# Patient Record
Sex: Male | Born: 1995 | Race: Black or African American | Hispanic: No | Marital: Single | Smoking: Never smoker
Health system: Southern US, Community
[De-identification: ages and names within clinical notes are randomized; demographics above are authoritative.]

## PROBLEM LIST (undated history)

## (undated) DIAGNOSIS — R55 Syncope and collapse: Secondary | ICD-10-CM

## (undated) HISTORY — DX: Syncope and collapse: R55

---
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
## 2017-01-08 ENCOUNTER — Ambulatory Visit (INDEPENDENT_AMBULATORY_CARE_PROVIDER_SITE_OTHER): Payer: No Typology Code available for payment source | Admitting: Physician Assistant

## 2017-01-08 ENCOUNTER — Encounter: Payer: Self-pay | Admitting: Physician Assistant

## 2017-01-08 VITALS — BP 110/76 | HR 64 | Ht 70.0 in | Wt 174.4 lb

## 2017-01-08 DIAGNOSIS — M545 Low back pain: Secondary | ICD-10-CM

## 2017-01-08 NOTE — Patient Instructions (Addendum)
Please perform the exercise program that Derek Chavez has prepared for you and gone over in detail.  In addition to the handout you were provided you can access your program through: [email or web address]   Your unique program code is: [email or web address]    It was great meeting you today.   Consider taking 400mg  ibuprofen every 6 hours to help with your pain.  You may continue the flexeril.  I have put in the referral, you will be contacted about this. If your pain suddenly worsens, please let someone know so you can be re-evaluated.   Back Pain, Adult Back pain is very common in adults.The cause of back pain is rarely dangerous and the pain often gets better over time.The cause of your back pain may not be known. Some common causes of back pain include:  Strain of the muscles or ligaments supporting the spine.  Wear and tear (degeneration) of the spinal disks.  Arthritis.  Direct injury to the back. For many people, back pain may return. Since back pain is rarely dangerous, most people can learn to manage this condition on their own. Follow these instructions at home: Watch your back pain for any changes. The following actions may help to lessen any discomfort you are feeling:  Remain active. It is stressful on your back to sit or stand in one place for long periods of time. Do not sit, drive, or stand in one place for more than 30 minutes at a time. Take short walks on even surfaces as soon as you are able.Try to increase the length of time you walk each day.  Exercise regularly as directed by your health care provider. Exercise helps your back heal faster. It also helps avoid future injury by keeping your muscles strong and flexible.  Do not stay in bed.Resting more than 1-2 days can delay your recovery.  Pay attention to your body when you bend and lift. The most comfortable positions are those that put less stress on your recovering back. Always use proper lifting techniques,  including:  Bending your knees.  Keeping the load close to your body.  Avoiding twisting.  Find a comfortable position to sleep. Use a firm mattress and lie on your side with your knees slightly bent. If you lie on your back, put a pillow under your knees.  Avoid feeling anxious or stressed.Stress increases muscle tension and can worsen back pain.It is important to recognize when you are anxious or stressed and learn ways to manage it, such as with exercise.  Take medicines only as directed by your health care provider. Over-the-counter medicines to reduce pain and inflammation are often the most helpful.Your health care provider may prescribe muscle relaxant drugs.These medicines help dull your pain so you can more quickly return to your normal activities and healthy exercise.  Apply ice to the injured area:  Put ice in a plastic bag.  Place a towel between your skin and the bag.  Leave the ice on for 20 minutes, 2-3 times a day for the first 2-3 days. After that, ice and heat may be alternated to reduce pain and spasms.  Maintain a healthy weight. Excess weight puts extra stress on your back and makes it difficult to maintain good posture. Contact a health care provider if:  You have pain that is not relieved with rest or medicine.  You have increasing pain going down into the legs or buttocks.  You have pain that does not improve in one week.  You have night  pain.  You lose weight.  You have a fever or chills. Get help right away if:  You develop new bowel or bladder control problems.  You have unusual weakness or numbness in your arms or legs.  You develop nausea or vomiting.  You develop abdominal pain.  You feel faint. This information is not intended to replace advice given to you by your health care provider. Make sure you discuss any questions you have with your health care provider. Document Released: 10/29/2005 Document Revised: 03/08/2016 Document  Reviewed: 03/02/2014 Elsevier Interactive Patient Education  2017 [employer].

## 2017-01-08 NOTE — Progress Notes (Signed)
Pre visit review using our clinic review tool, if applicable. No additional management support is needed unless otherwise documented below in the visit note. 

## 2017-01-08 NOTE — Progress Notes (Signed)
Subjective:    Patient ID: Derek Chavez, male    DOB: 11/28/1995, 21 y.o.   MRN: [id number]  HPI   Derek Chavez is an 21 y/o male who presents to clinic today to establish care. 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  Acute Concerns: Back injury -- 3 weeks ago patient had an injury during baseball practice. When he was swinging he felt that he over-rotated his back and had pain in his bilateral lower back. He went to an urgent care and an xray was performed. Lumbar xray on 12/24/16 -- no acute or significant chronic bony abnormality of lumbar spine.  He was given a steroid dose pack and flexeril. He has completed the steroid dose pack and is taking the Flexeril every night. He overall feels as though his pain has improved, however he doesn't feel 100%. He and his mother would like a referral to orthopedics.   He has never injured his back before. He is currently not playing baseball, but is attending practice. He is doing some stretches that his Event organiserAthletic Trainer has recommended. He denies any saddle anesthesia or issues with bowel/bladder. Denies fevers.  He is otherwise healthy with no previous health issues.  Review of Systems See HPI  History reviewed. No pertinent past medical history.   Social History   Social History  . Marital status: Single    Spouse name: N/A  . Number of children: N/A  . Years of education: N/A   Occupational History  . Not on file.   Social History Main Topics  . Smoking status: Never Smoker  . Smokeless tobacco: Never Used  . Alcohol use No  . Drug use: No  . Sexual activity: Yes   Other Topics Concern  . Not on file   Social History Narrative   Freshman, [employer] -- studying sports science, play baseball   [place], [place]             History reviewed. No pertinent surgical history.  History reviewed. No pertinent family history.  No Known  Allergies  Current Outpatient Prescriptions on File Prior to Visit  Medication Sig Dispense Refill  . cyclobenzaprine (FLEXERIL) 5 MG tablet Take 1 tablet (5 mg total) by mouth 3 (three) times daily. 30 tablet 0  . methylPREDNISolone (MEDROL DOSEPAK) 4 MG TBPK tablet follow package directions 21 tablet 0   No current facility-administered medications on file prior to visit.     BP 110/76 (BP Location: Left Arm, Patient Position: Sitting, Cuff Size: Normal)   Pulse 64   Ht 5\' 10"  (1.778 m)   Wt 174 lb 6.4 oz (79.1 kg)   SpO2 99%   BMI 25.02 kg/m       Objective:   Physical Exam  Constitutional: He appears well-developed and well-nourished. He is cooperative.  Non-toxic appearance. He does not have a sickly appearance. He does not appear ill. No distress.  HENT:  Head: Normocephalic and atraumatic.  Cardiovascular: Normal rate, regular rhythm and normal heart sounds.   No LE swelling  Pulmonary/Chest: Effort normal and breath sounds normal. No accessory muscle usage. No respiratory distress.  Musculoskeletal:  Bilateral lower back tenderness to palpation, no spinal process tenderness, no active spasm, decreased ROM due to pain and stiffness with back rotation/lateral bends and flexion/extension  Neurological: He is alert. No cranial nerve deficit or sensory deficit.  Skin: Skin is warm and dry.  Nursing note and vitals reviewed.  Assessment & Plan:  1. Acute bilateral low back pain, with sciatica presence unspecified Referred to Dr. Zachery Dauer at [facility] per patient and mother's request. Continue daily Flexeril. May use ibuprofen during the day to help with any inflammation/pain. Follow package instructions. Advised patient to seek medical attention if pain changes in any way. Our clinic Athletic Trainer, [facility], was in to see patient and provided him with a list of exercises. Appreciate coordination of care.  Jarold Motto PA-C 01/09/17

## 2017-01-24 ENCOUNTER — Encounter: Payer: Self-pay | Admitting: Physician Assistant

## 2017-02-26 ENCOUNTER — Telehealth: Payer: Self-pay | Admitting: Sports Medicine

## 2017-02-26 NOTE — Telephone Encounter (Signed)
ROI faxed on 01/11/2017 to [facility], [place], [place] and refaxed 02/26/2017

## 2017-03-07 ENCOUNTER — Telehealth: Payer: Self-pay | Admitting: Sports Medicine

## 2017-03-07 NOTE — Telephone Encounter (Signed)
Rec'd from [facility] forward 82 pages to Gaspar Bidding DO

## 2017-03-12 ENCOUNTER — Encounter: Payer: Self-pay | Admitting: Physician Assistant

## 2018-06-03 IMAGING — DX DG LUMBAR SPINE COMPLETE 4+V
5 series · 5 of 5 positions shown · non-contrast
Comparison: None in PACs

CLINICAL DATA: Injury of lower back playing baseball 2 weeks ago.
The patient experiences sharp pains with certain movements.

EXAM:
LUMBAR SPINE - COMPLETE 4+ VIEW

[l-spine ap]
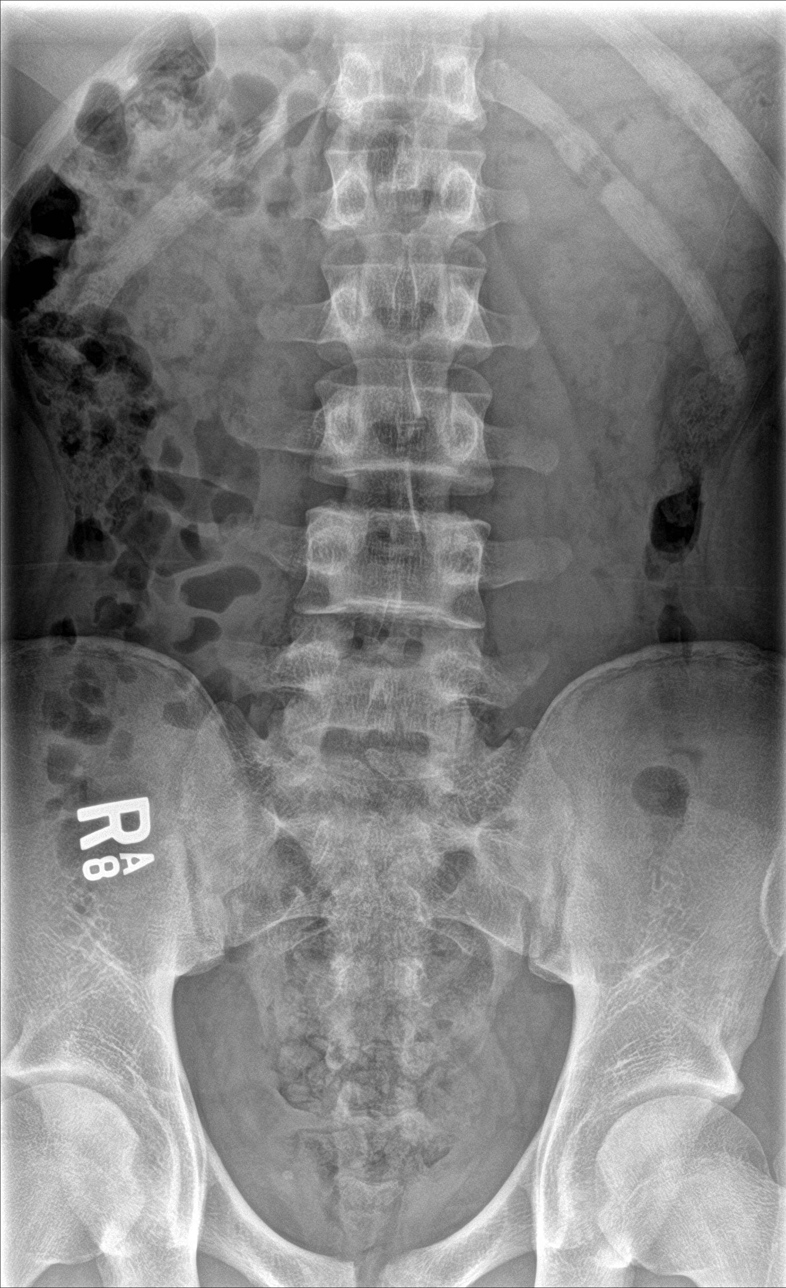

[l-spine obl (1 of 2)]
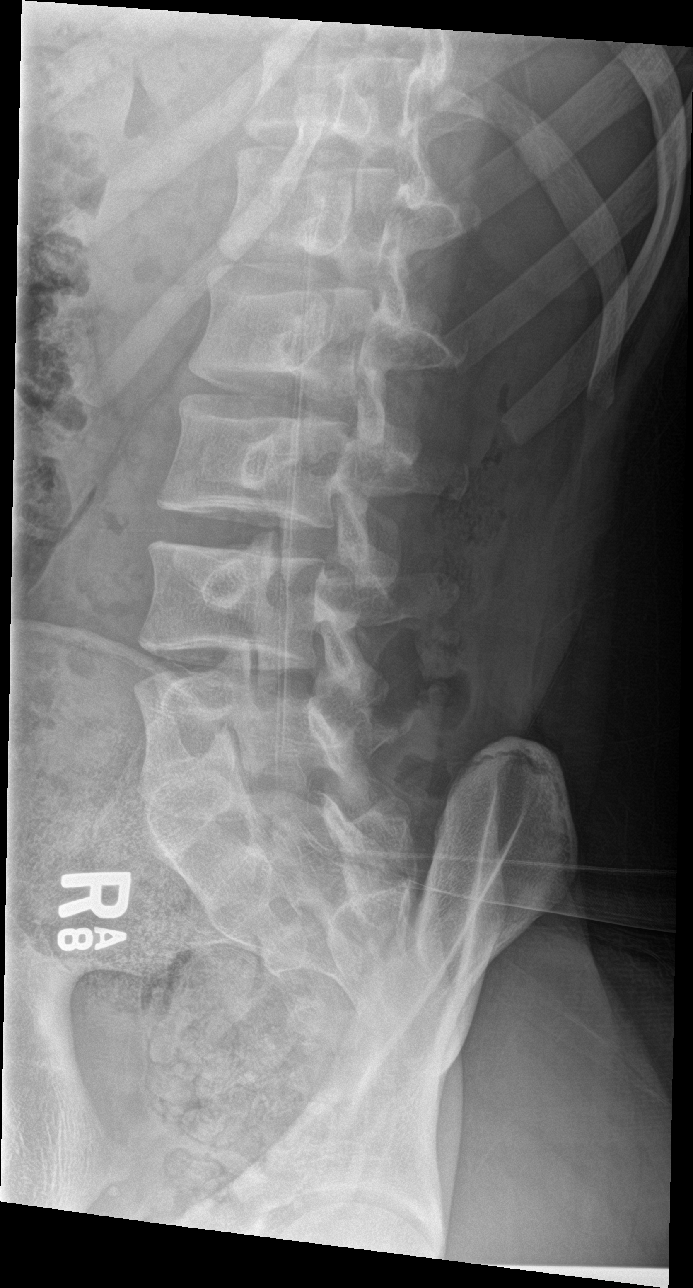

[l-spine obl (2 of 2)]
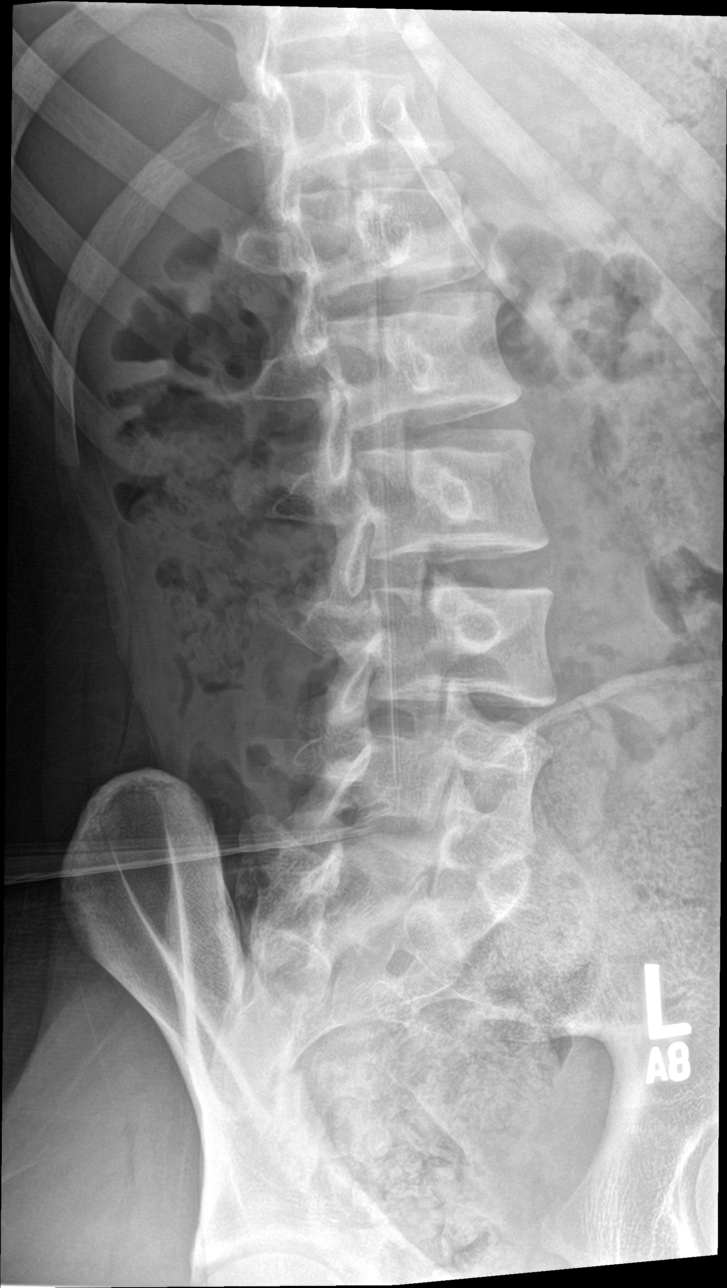

[l-spine lat]
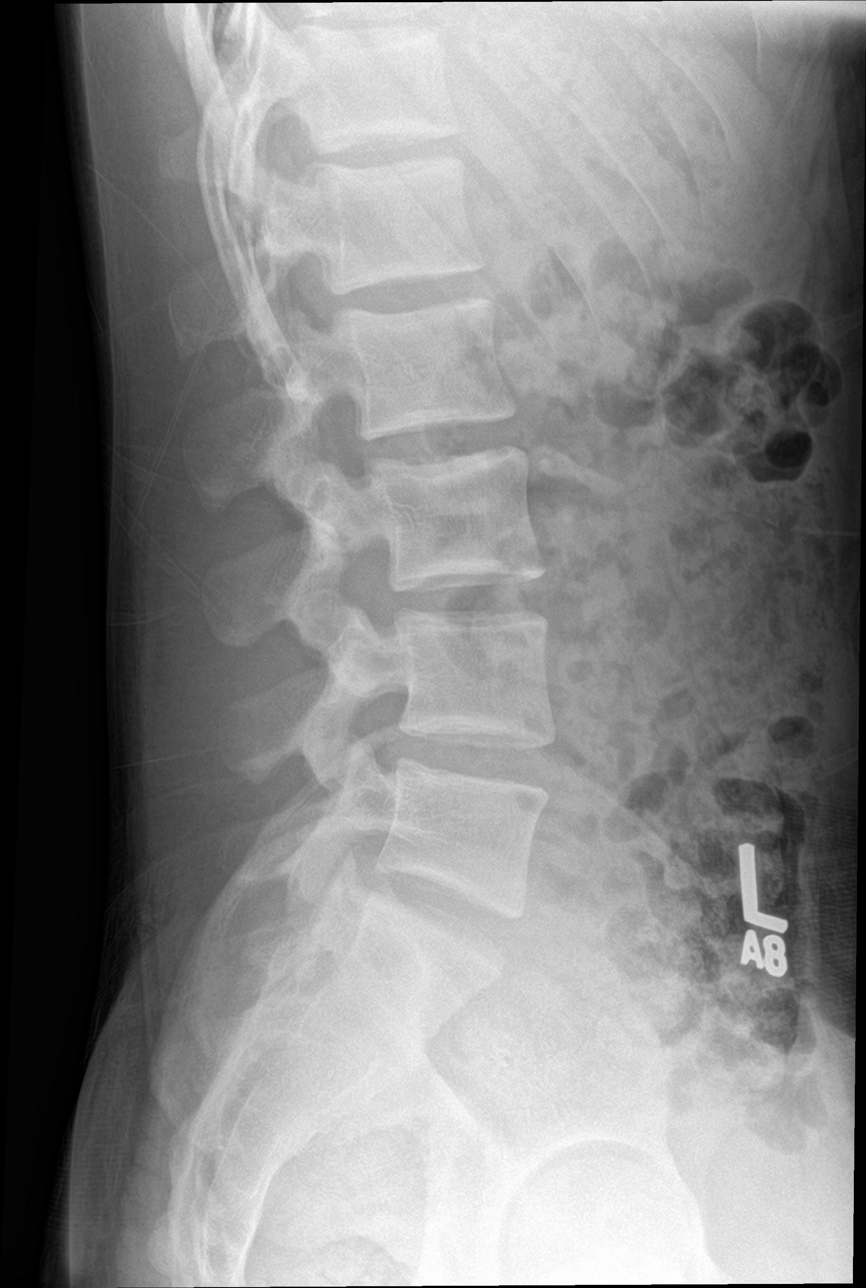

[l-spine spot]
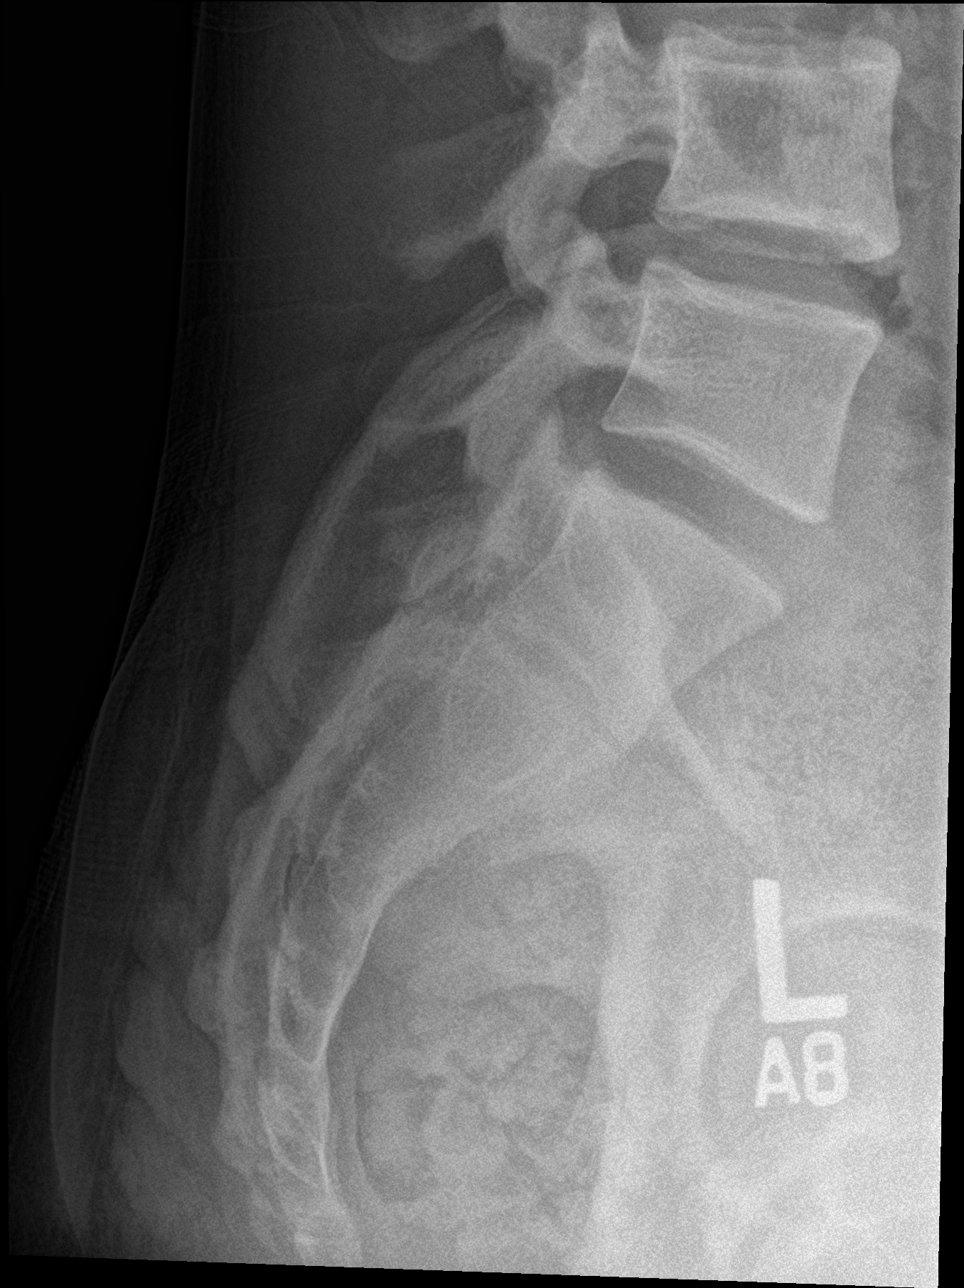

[5 of 5 positions shown; findings below may reference images not displayed]

FINDINGS: The lumbar vertebral bodies are preserved in height. The pedicles
and transverse processes are intact. There is a spina bifida occulta
S1. There is no spondylolisthesis. The disc space heights are well
maintained. No acute abnormality of the sacrum is observed.
IMPRESSION: There is no acute or significant chronic bony abnormality of the
lumbar spine.

## 2019-07-14 ENCOUNTER — Telehealth: Payer: Self-pay | Admitting: Physician Assistant

## 2019-07-14 NOTE — Telephone Encounter (Signed)
I reached out to the patient to get them scheduled for either a physical or a follow up appointment, they had not been seen since 2018. Patient requested Aldona Bar be removed as his primary care provider. No further action required.
